# Patient Record
Sex: Male | Born: 1980
Health system: Southern US, Community
[De-identification: ages and names within clinical notes are randomized; demographics above are authoritative.]

## PROBLEM LIST (undated history)

## (undated) DIAGNOSIS — F32A Depression, unspecified: Secondary | ICD-10-CM

## (undated) DIAGNOSIS — K219 Gastro-esophageal reflux disease without esophagitis: Secondary | ICD-10-CM

## (undated) DIAGNOSIS — E7800 Pure hypercholesterolemia, unspecified: Secondary | ICD-10-CM

## (undated) DIAGNOSIS — F419 Anxiety disorder, unspecified: Secondary | ICD-10-CM

## (undated) DIAGNOSIS — F329 Major depressive disorder, single episode, unspecified: Secondary | ICD-10-CM

## (undated) DIAGNOSIS — M549 Dorsalgia, unspecified: Secondary | ICD-10-CM

## (undated) HISTORY — PX: BACK SURGERY: SHX140

## (undated) HISTORY — PX: OTHER SURGICAL HISTORY: SHX169

---

## 2000-03-03 ENCOUNTER — Emergency Department (HOSPITAL_COMMUNITY): Admission: EM | Admit: 2000-03-03 | Discharge: 2000-03-03 | Payer: Self-pay | Admitting: Emergency Medicine

## 2000-03-03 ENCOUNTER — Encounter: Payer: Self-pay | Admitting: Emergency Medicine

## 2001-01-01 ENCOUNTER — Ambulatory Visit (HOSPITAL_COMMUNITY): Admission: RE | Admit: 2001-01-01 | Discharge: 2001-01-01 | Payer: Self-pay | Admitting: Family Medicine

## 2001-01-01 ENCOUNTER — Encounter: Payer: Self-pay | Admitting: Family Medicine

## 2002-09-04 ENCOUNTER — Emergency Department (HOSPITAL_COMMUNITY): Admission: EM | Admit: 2002-09-04 | Discharge: 2002-09-04 | Payer: Self-pay | Admitting: Emergency Medicine

## 2003-12-25 ENCOUNTER — Emergency Department (HOSPITAL_COMMUNITY): Admission: EM | Admit: 2003-12-25 | Discharge: 2003-12-25 | Payer: Self-pay | Admitting: Emergency Medicine

## 2005-03-10 ENCOUNTER — Emergency Department (HOSPITAL_COMMUNITY): Admission: EM | Admit: 2005-03-10 | Discharge: 2005-03-10 | Payer: Self-pay | Admitting: Emergency Medicine

## 2005-12-19 ENCOUNTER — Emergency Department (HOSPITAL_COMMUNITY): Admission: EM | Admit: 2005-12-19 | Discharge: 2005-12-19 | Payer: Self-pay | Admitting: Family Medicine

## 2011-07-31 ENCOUNTER — Other Ambulatory Visit: Payer: Self-pay | Admitting: Neurosurgery

## 2011-07-31 DIAGNOSIS — M545 Low back pain, unspecified: Secondary | ICD-10-CM

## 2011-08-01 ENCOUNTER — Other Ambulatory Visit: Payer: Self-pay

## 2011-08-03 ENCOUNTER — Ambulatory Visit
Admission: RE | Admit: 2011-08-03 | Discharge: 2011-08-03 | Disposition: A | Discharge status: Home / Self Care | Payer: Self-pay | Source: Ambulatory Visit | Attending: Neurosurgery | Admitting: Neurosurgery

## 2011-08-03 DIAGNOSIS — M545 Low back pain, unspecified: Secondary | ICD-10-CM

## 2014-10-25 ENCOUNTER — Other Ambulatory Visit: Payer: Self-pay | Admitting: *Deleted

## 2014-10-25 DIAGNOSIS — M961 Postlaminectomy syndrome, not elsewhere classified: Secondary | ICD-10-CM

## 2014-10-25 DIAGNOSIS — M545 Low back pain: Secondary | ICD-10-CM

## 2014-10-25 DIAGNOSIS — M5136 Other intervertebral disc degeneration, lumbar region: Secondary | ICD-10-CM

## 2014-11-02 ENCOUNTER — Ambulatory Visit
Admission: RE | Admit: 2014-11-02 | Discharge: 2014-11-02 | Disposition: A | Discharge status: Home / Self Care | Payer: BLUE CROSS/BLUE SHIELD | Source: Ambulatory Visit | Attending: *Deleted | Admitting: *Deleted

## 2014-11-02 DIAGNOSIS — M545 Low back pain: Secondary | ICD-10-CM

## 2014-11-02 DIAGNOSIS — M961 Postlaminectomy syndrome, not elsewhere classified: Secondary | ICD-10-CM

## 2014-11-02 DIAGNOSIS — M5136 Other intervertebral disc degeneration, lumbar region: Secondary | ICD-10-CM

## 2014-11-02 MED ORDER — GADOBENATE DIMEGLUMINE 529 MG/ML IV SOLN
20.0000 mL | Freq: Once | INTRAVENOUS | Status: AC | PRN
  Administered 2014-11-02: 20 mL via INTRAVENOUS

## 2015-12-05 DIAGNOSIS — G894 Chronic pain syndrome: Secondary | ICD-10-CM | POA: Diagnosis not present

## 2015-12-05 DIAGNOSIS — M5126 Other intervertebral disc displacement, lumbar region: Secondary | ICD-10-CM | POA: Diagnosis not present

## 2015-12-20 DIAGNOSIS — F17200 Nicotine dependence, unspecified, uncomplicated: Secondary | ICD-10-CM | POA: Diagnosis not present

## 2015-12-20 DIAGNOSIS — E782 Mixed hyperlipidemia: Secondary | ICD-10-CM | POA: Diagnosis not present

## 2015-12-20 DIAGNOSIS — F419 Anxiety disorder, unspecified: Secondary | ICD-10-CM | POA: Diagnosis not present

## 2015-12-20 DIAGNOSIS — G8929 Other chronic pain: Secondary | ICD-10-CM | POA: Diagnosis not present

## 2016-01-02 DIAGNOSIS — G894 Chronic pain syndrome: Secondary | ICD-10-CM | POA: Diagnosis not present

## 2016-02-01 DIAGNOSIS — G894 Chronic pain syndrome: Secondary | ICD-10-CM | POA: Diagnosis not present

## 2016-02-29 DIAGNOSIS — M47896 Other spondylosis, lumbar region: Secondary | ICD-10-CM | POA: Diagnosis not present

## 2016-02-29 DIAGNOSIS — M47817 Spondylosis without myelopathy or radiculopathy, lumbosacral region: Secondary | ICD-10-CM | POA: Diagnosis not present

## 2016-04-01 DIAGNOSIS — Z79899 Other long term (current) drug therapy: Secondary | ICD-10-CM | POA: Diagnosis not present

## 2016-04-01 DIAGNOSIS — M791 Myalgia: Secondary | ICD-10-CM | POA: Diagnosis not present

## 2016-04-01 DIAGNOSIS — M47896 Other spondylosis, lumbar region: Secondary | ICD-10-CM | POA: Diagnosis not present

## 2016-04-03 ENCOUNTER — Other Ambulatory Visit: Payer: Self-pay | Admitting: Rehabilitation

## 2016-04-03 DIAGNOSIS — M47816 Spondylosis without myelopathy or radiculopathy, lumbar region: Secondary | ICD-10-CM

## 2016-04-27 ENCOUNTER — Inpatient Hospital Stay: Admission: RE | Admit: 2016-04-27 | Payer: BLUE CROSS/BLUE SHIELD | Source: Ambulatory Visit

## 2016-05-03 DIAGNOSIS — M47817 Spondylosis without myelopathy or radiculopathy, lumbosacral region: Secondary | ICD-10-CM | POA: Diagnosis not present

## 2016-05-03 DIAGNOSIS — G894 Chronic pain syndrome: Secondary | ICD-10-CM | POA: Diagnosis not present

## 2016-05-30 DIAGNOSIS — M791 Myalgia: Secondary | ICD-10-CM | POA: Diagnosis not present

## 2016-05-30 DIAGNOSIS — M47896 Other spondylosis, lumbar region: Secondary | ICD-10-CM | POA: Diagnosis not present

## 2016-05-30 DIAGNOSIS — M4716 Other spondylosis with myelopathy, lumbar region: Secondary | ICD-10-CM | POA: Diagnosis not present

## 2016-05-30 DIAGNOSIS — G894 Chronic pain syndrome: Secondary | ICD-10-CM | POA: Diagnosis not present

## 2016-06-12 DIAGNOSIS — M5126 Other intervertebral disc displacement, lumbar region: Secondary | ICD-10-CM | POA: Diagnosis not present

## 2016-06-28 DIAGNOSIS — G894 Chronic pain syndrome: Secondary | ICD-10-CM | POA: Diagnosis not present

## 2016-06-28 DIAGNOSIS — M5106 Intervertebral disc disorders with myelopathy, lumbar region: Secondary | ICD-10-CM | POA: Diagnosis not present

## 2016-07-30 DIAGNOSIS — M47896 Other spondylosis, lumbar region: Secondary | ICD-10-CM | POA: Diagnosis not present

## 2016-07-30 DIAGNOSIS — Z79899 Other long term (current) drug therapy: Secondary | ICD-10-CM | POA: Diagnosis not present

## 2016-07-30 DIAGNOSIS — G894 Chronic pain syndrome: Secondary | ICD-10-CM | POA: Diagnosis not present

## 2016-07-30 DIAGNOSIS — M791 Myalgia: Secondary | ICD-10-CM | POA: Diagnosis not present

## 2016-07-30 DIAGNOSIS — M5106 Intervertebral disc disorders with myelopathy, lumbar region: Secondary | ICD-10-CM | POA: Diagnosis not present

## 2016-08-27 DIAGNOSIS — M4716 Other spondylosis with myelopathy, lumbar region: Secondary | ICD-10-CM | POA: Diagnosis not present

## 2016-08-27 DIAGNOSIS — M5106 Intervertebral disc disorders with myelopathy, lumbar region: Secondary | ICD-10-CM | POA: Diagnosis not present

## 2016-08-27 DIAGNOSIS — M545 Low back pain: Secondary | ICD-10-CM | POA: Diagnosis not present

## 2016-08-27 DIAGNOSIS — G894 Chronic pain syndrome: Secondary | ICD-10-CM | POA: Diagnosis not present

## 2016-09-03 ENCOUNTER — Other Ambulatory Visit: Payer: Self-pay | Admitting: Orthopaedic Surgery

## 2016-09-03 DIAGNOSIS — M5106 Intervertebral disc disorders with myelopathy, lumbar region: Secondary | ICD-10-CM

## 2016-09-24 DIAGNOSIS — G894 Chronic pain syndrome: Secondary | ICD-10-CM | POA: Diagnosis not present

## 2016-09-24 DIAGNOSIS — M5106 Intervertebral disc disorders with myelopathy, lumbar region: Secondary | ICD-10-CM | POA: Diagnosis not present

## 2016-09-24 DIAGNOSIS — M4716 Other spondylosis with myelopathy, lumbar region: Secondary | ICD-10-CM | POA: Diagnosis not present

## 2016-09-24 DIAGNOSIS — M545 Low back pain: Secondary | ICD-10-CM | POA: Diagnosis not present

## 2016-10-21 ENCOUNTER — Inpatient Hospital Stay
Admission: RE | Admit: 2016-10-21 | Discharge: 2016-10-21 | Disposition: A | Discharge status: Home / Self Care | Payer: BLUE CROSS/BLUE SHIELD | Source: Ambulatory Visit | Attending: Orthopaedic Surgery | Admitting: Orthopaedic Surgery

## 2016-10-31 DIAGNOSIS — M5106 Intervertebral disc disorders with myelopathy, lumbar region: Secondary | ICD-10-CM | POA: Diagnosis not present

## 2016-10-31 DIAGNOSIS — G894 Chronic pain syndrome: Secondary | ICD-10-CM | POA: Diagnosis not present

## 2016-11-26 DIAGNOSIS — M5106 Intervertebral disc disorders with myelopathy, lumbar region: Secondary | ICD-10-CM | POA: Diagnosis not present

## 2016-11-26 DIAGNOSIS — M791 Myalgia: Secondary | ICD-10-CM | POA: Diagnosis not present

## 2016-12-23 DIAGNOSIS — G894 Chronic pain syndrome: Secondary | ICD-10-CM | POA: Diagnosis not present

## 2016-12-23 DIAGNOSIS — E782 Mixed hyperlipidemia: Secondary | ICD-10-CM | POA: Diagnosis not present

## 2016-12-23 DIAGNOSIS — F419 Anxiety disorder, unspecified: Secondary | ICD-10-CM | POA: Diagnosis not present

## 2016-12-23 DIAGNOSIS — M545 Low back pain: Secondary | ICD-10-CM | POA: Diagnosis not present

## 2016-12-23 DIAGNOSIS — M5106 Intervertebral disc disorders with myelopathy, lumbar region: Secondary | ICD-10-CM | POA: Diagnosis not present

## 2016-12-23 DIAGNOSIS — G8929 Other chronic pain: Secondary | ICD-10-CM | POA: Diagnosis not present

## 2016-12-23 DIAGNOSIS — D45 Polycythemia vera: Secondary | ICD-10-CM | POA: Diagnosis not present

## 2017-01-20 DIAGNOSIS — G894 Chronic pain syndrome: Secondary | ICD-10-CM | POA: Diagnosis not present

## 2017-01-20 DIAGNOSIS — Z79899 Other long term (current) drug therapy: Secondary | ICD-10-CM | POA: Diagnosis not present

## 2017-02-15 ENCOUNTER — Other Ambulatory Visit: Payer: BLUE CROSS/BLUE SHIELD

## 2017-02-28 DIAGNOSIS — Z6834 Body mass index (BMI) 34.0-34.9, adult: Secondary | ICD-10-CM | POA: Diagnosis not present

## 2017-02-28 DIAGNOSIS — G894 Chronic pain syndrome: Secondary | ICD-10-CM | POA: Diagnosis not present

## 2017-02-28 DIAGNOSIS — M5416 Radiculopathy, lumbar region: Secondary | ICD-10-CM | POA: Diagnosis not present

## 2017-02-28 DIAGNOSIS — M5126 Other intervertebral disc displacement, lumbar region: Secondary | ICD-10-CM | POA: Diagnosis not present

## 2017-03-10 ENCOUNTER — Other Ambulatory Visit: Payer: BLUE CROSS/BLUE SHIELD

## 2017-03-28 DIAGNOSIS — M4716 Other spondylosis with myelopathy, lumbar region: Secondary | ICD-10-CM | POA: Diagnosis not present

## 2017-03-28 DIAGNOSIS — G894 Chronic pain syndrome: Secondary | ICD-10-CM | POA: Diagnosis not present

## 2017-03-28 DIAGNOSIS — F419 Anxiety disorder, unspecified: Secondary | ICD-10-CM | POA: Diagnosis not present

## 2017-03-28 DIAGNOSIS — K219 Gastro-esophageal reflux disease without esophagitis: Secondary | ICD-10-CM | POA: Diagnosis not present

## 2017-03-28 DIAGNOSIS — M791 Myalgia: Secondary | ICD-10-CM | POA: Diagnosis not present

## 2017-03-28 DIAGNOSIS — M5106 Intervertebral disc disorders with myelopathy, lumbar region: Secondary | ICD-10-CM | POA: Diagnosis not present

## 2017-04-30 DIAGNOSIS — G894 Chronic pain syndrome: Secondary | ICD-10-CM | POA: Diagnosis not present

## 2017-05-28 DIAGNOSIS — Z6834 Body mass index (BMI) 34.0-34.9, adult: Secondary | ICD-10-CM | POA: Diagnosis not present

## 2017-05-28 DIAGNOSIS — M5106 Intervertebral disc disorders with myelopathy, lumbar region: Secondary | ICD-10-CM | POA: Diagnosis not present

## 2017-05-28 DIAGNOSIS — I10 Essential (primary) hypertension: Secondary | ICD-10-CM | POA: Diagnosis not present

## 2017-05-28 DIAGNOSIS — G894 Chronic pain syndrome: Secondary | ICD-10-CM | POA: Diagnosis not present

## 2017-06-25 DIAGNOSIS — M545 Low back pain: Secondary | ICD-10-CM | POA: Diagnosis not present

## 2017-06-25 DIAGNOSIS — M5126 Other intervertebral disc displacement, lumbar region: Secondary | ICD-10-CM | POA: Diagnosis not present

## 2017-06-25 DIAGNOSIS — G894 Chronic pain syndrome: Secondary | ICD-10-CM | POA: Diagnosis not present

## 2017-07-23 DIAGNOSIS — G894 Chronic pain syndrome: Secondary | ICD-10-CM | POA: Diagnosis not present

## 2017-07-23 DIAGNOSIS — M791 Myalgia, unspecified site: Secondary | ICD-10-CM | POA: Diagnosis not present

## 2017-07-23 DIAGNOSIS — M545 Low back pain: Secondary | ICD-10-CM | POA: Diagnosis not present

## 2017-07-23 DIAGNOSIS — Z6834 Body mass index (BMI) 34.0-34.9, adult: Secondary | ICD-10-CM | POA: Diagnosis not present

## 2017-07-23 DIAGNOSIS — Z79899 Other long term (current) drug therapy: Secondary | ICD-10-CM | POA: Diagnosis not present

## 2017-08-27 DIAGNOSIS — G894 Chronic pain syndrome: Secondary | ICD-10-CM | POA: Diagnosis not present

## 2017-08-27 DIAGNOSIS — M545 Low back pain: Secondary | ICD-10-CM | POA: Diagnosis not present

## 2017-09-24 DIAGNOSIS — F419 Anxiety disorder, unspecified: Secondary | ICD-10-CM | POA: Diagnosis not present

## 2017-09-24 DIAGNOSIS — M5126 Other intervertebral disc displacement, lumbar region: Secondary | ICD-10-CM | POA: Diagnosis not present

## 2017-09-24 DIAGNOSIS — E782 Mixed hyperlipidemia: Secondary | ICD-10-CM | POA: Diagnosis not present

## 2017-09-24 DIAGNOSIS — Z79899 Other long term (current) drug therapy: Secondary | ICD-10-CM | POA: Diagnosis not present

## 2017-09-24 DIAGNOSIS — G894 Chronic pain syndrome: Secondary | ICD-10-CM | POA: Diagnosis not present

## 2017-09-24 DIAGNOSIS — G4700 Insomnia, unspecified: Secondary | ICD-10-CM | POA: Diagnosis not present

## 2017-09-24 DIAGNOSIS — M545 Low back pain: Secondary | ICD-10-CM | POA: Diagnosis not present

## 2017-10-22 DIAGNOSIS — G894 Chronic pain syndrome: Secondary | ICD-10-CM | POA: Diagnosis not present

## 2017-11-19 DIAGNOSIS — M5106 Intervertebral disc disorders with myelopathy, lumbar region: Secondary | ICD-10-CM | POA: Diagnosis not present

## 2017-11-19 DIAGNOSIS — M545 Low back pain: Secondary | ICD-10-CM | POA: Diagnosis not present

## 2017-11-19 DIAGNOSIS — M791 Myalgia, unspecified site: Secondary | ICD-10-CM | POA: Diagnosis not present

## 2017-11-19 DIAGNOSIS — G894 Chronic pain syndrome: Secondary | ICD-10-CM | POA: Diagnosis not present

## 2017-12-25 DIAGNOSIS — M545 Low back pain: Secondary | ICD-10-CM | POA: Diagnosis not present

## 2017-12-25 DIAGNOSIS — G894 Chronic pain syndrome: Secondary | ICD-10-CM | POA: Diagnosis not present

## 2017-12-25 DIAGNOSIS — M5106 Intervertebral disc disorders with myelopathy, lumbar region: Secondary | ICD-10-CM | POA: Diagnosis not present

## 2017-12-25 DIAGNOSIS — Z79899 Other long term (current) drug therapy: Secondary | ICD-10-CM | POA: Diagnosis not present

## 2017-12-25 DIAGNOSIS — M4716 Other spondylosis with myelopathy, lumbar region: Secondary | ICD-10-CM | POA: Diagnosis not present

## 2018-01-22 DIAGNOSIS — M791 Myalgia, unspecified site: Secondary | ICD-10-CM | POA: Diagnosis not present

## 2018-01-22 DIAGNOSIS — M545 Low back pain: Secondary | ICD-10-CM | POA: Diagnosis not present

## 2018-01-22 DIAGNOSIS — G894 Chronic pain syndrome: Secondary | ICD-10-CM | POA: Diagnosis not present

## 2018-01-22 DIAGNOSIS — M5106 Intervertebral disc disorders with myelopathy, lumbar region: Secondary | ICD-10-CM | POA: Diagnosis not present

## 2018-01-28 ENCOUNTER — Other Ambulatory Visit: Payer: Self-pay | Admitting: Orthopaedic Surgery

## 2018-02-05 DIAGNOSIS — M545 Low back pain: Secondary | ICD-10-CM | POA: Diagnosis not present

## 2018-02-19 DIAGNOSIS — M545 Low back pain: Secondary | ICD-10-CM | POA: Diagnosis not present

## 2018-02-19 DIAGNOSIS — M5416 Radiculopathy, lumbar region: Secondary | ICD-10-CM | POA: Diagnosis not present

## 2018-02-19 DIAGNOSIS — G894 Chronic pain syndrome: Secondary | ICD-10-CM | POA: Diagnosis not present

## 2018-02-19 DIAGNOSIS — M5126 Other intervertebral disc displacement, lumbar region: Secondary | ICD-10-CM | POA: Diagnosis not present

## 2018-02-22 ENCOUNTER — Ambulatory Visit (HOSPITAL_COMMUNITY)
Admission: EM | Admit: 2018-02-22 | Discharge: 2018-02-22 | Disposition: A | Discharge status: Home / Self Care | Payer: BLUE CROSS/BLUE SHIELD | Attending: Family Medicine | Admitting: Family Medicine

## 2018-02-22 ENCOUNTER — Encounter (HOSPITAL_COMMUNITY): Payer: Self-pay | Admitting: *Deleted

## 2018-02-22 ENCOUNTER — Other Ambulatory Visit: Payer: Self-pay

## 2018-02-22 DIAGNOSIS — R1011 Right upper quadrant pain: Secondary | ICD-10-CM

## 2018-02-22 HISTORY — DX: Gastro-esophageal reflux disease without esophagitis: K21.9

## 2018-02-22 HISTORY — DX: Dorsalgia, unspecified: M54.9

## 2018-02-22 HISTORY — DX: Anxiety disorder, unspecified: F41.9

## 2018-02-22 HISTORY — DX: Depression, unspecified: F32.A

## 2018-02-22 HISTORY — DX: Major depressive disorder, single episode, unspecified: F32.9

## 2018-02-22 HISTORY — DX: Pure hypercholesterolemia, unspecified: E78.00

## 2018-02-22 LAB — POCT URINALYSIS DIP (DEVICE)
Bilirubin Urine: NEGATIVE
GLUCOSE, UA: NEGATIVE mg/dL
HGB URINE DIPSTICK: NEGATIVE
Ketones, ur: NEGATIVE mg/dL
Leukocytes, UA: NEGATIVE
Nitrite: NEGATIVE
Protein, ur: NEGATIVE mg/dL
Specific Gravity, Urine: 1.025 (ref 1.005–1.030)
UROBILINOGEN UA: 0.2 mg/dL (ref 0.0–1.0)
pH: 6 (ref 5.0–8.0)

## 2018-02-22 NOTE — ED Provider Notes (Signed)
[facility]    CSN: [id number] Arrival date & time: 02/22/18  1901     History   Chief Complaint Chief Complaint  Patient presents with  . Flank Pain    HPI Leonard Ellis is a 37 y.o. male.   This is a 37 y.o. Male, with history of anxiety, depression, GERD, hypercholesteremia, chronic back pain, comes in to urgent care today for 1.5 week duration of right upper quadrant abdominal pain. Pain goes around to the back. Pain most prominent with palpation and when he lays on the right side. Pain is gradually getting worst. It is a stabbing sharp pain. No association with food noted. He occasionally feel a bit nauseous but no vomiting and diarrhea. He have been kind of constipated recently. LBM today. No fever reported.       Past Medical History:  Diagnosis Date  . Anxiety   . Back pain   . Depression   . GERD (gastroesophageal reflux disease)   . Hypercholesteremia     There are no active problems to display for this patient.   Past Surgical History:  Procedure Laterality Date  . BACK SURGERY    . pyloric stenosis         Home Medications    Prior to Admission medications   Medication Sig Start Date End Date Taking? Authorizing Provider  ALPRAZOLAM PO Take by mouth.   Yes [provider]  DULoxetine HCl (CYMBALTA PO) Take by mouth.   Yes [provider]  HYDROCODONE BITARTRATE PO Take by mouth.   Yes [provider]  Omeprazole (PRILOSEC PO) Take by mouth.   Yes [provider]  SIMVASTATIN PO Take by mouth.   Yes [provider]    Family History Family History  Adopted: Yes  Family history unknown: Yes    Social History Social History   Tobacco Use  . Smoking status: Former Games developer  . Smokeless tobacco: Former Engineer, water Use Topics  . Alcohol use: Yes    Comment: occasionally  . Drug use: Never     Allergies   Patient has no known allergies.   Review of Systems Review of  Systems  Constitutional: Negative for chills, fatigue and fever.  Respiratory: Negative for cough and shortness of breath.   Cardiovascular: Negative for chest pain and palpitations.  Gastrointestinal: Positive for abdominal pain. Negative for diarrhea, nausea and vomiting.  Neurological: Negative for dizziness and headaches.     Physical Exam Triage Vital Signs ED Triage Vitals  Enc Vitals Group     BP 02/22/18 1920 (!) 142/87     Pulse Rate 02/22/18 1918 92     Resp 02/22/18 1918 18     Temp 02/22/18 1918 98 F (36.7 C)     Temp Source 02/22/18 1918 Oral     SpO2 02/22/18 1918 96 %     Weight --      Height --      Head Circumference --      Peak Flow --      Pain Score 02/22/18 1920 5     Pain Loc --      Pain Edu? --      Excl. in GC? --    No data found.  Updated Vital Signs BP (!) 142/87   Pulse 92   Temp 98 F (36.7 C) (Oral)   Resp 18   SpO2 96%  Physical Exam  Constitutional: He is oriented to person, place, and  time. He appears well-developed and well-nourished.  HENT:  Head: Normocephalic and atraumatic.  Cardiovascular: Normal rate, regular rhythm and normal heart sounds.  No murmur heard. Pulmonary/Chest: Effort normal and breath sounds normal. He has no wheezes.  Abdominal: Soft. Bowel sounds are normal. He exhibits no mass. There is tenderness (Tender to palpate over RUQ). There is no guarding. No hernia.  Musculoskeletal: Normal range of motion.  Neurological: He is alert and oriented to person, place, and time.  Skin: Skin is warm and dry.  Nursing note and vitals reviewed.    UC Treatments / Results  Labs (all labs ordered are listed, but only abnormal results are displayed) Labs Reviewed  POCT URINALYSIS DIP (DEVICE)    EKG None  Radiology No results found.  Procedures Procedures (including critical care time)  Medications Ordered in UC Medications - No data to display  Initial Impression / Assessment and Plan / UC Course  I  have reviewed the triage vital signs and the nursing notes.  Pertinent labs & imaging results that were available during my care of the patient were reviewed by me and considered in my medical decision making (see chart for details).  Final Clinical Impressions(s) / UC Diagnoses   Final diagnoses:  Right upper quadrant abdominal pain   Patient presents today for RUQ pain. Urinalysis obtained from triage was unremarkable. He specifically wanted an ultrasound for his pain but informed that we do not offer ultrasound or CT abd in the urgent care. Abd xray 2 views would not be beneficial for him. I do agree that patient needs US or CT abd. Advised patient to f/u with PCP for further diagnostic testing. Patient does not exhibit any red flags on exam that would prompt an ER visit.   Discharge Instructions   None    ED Prescriptions    None     Controlled Substance Prescriptions [place] Controlled Substance Registry consulted? Yes   Lucia EstelleZheng, Diamon Reddinger, NP 02/22/18 1949

## 2018-02-22 NOTE — ED Triage Notes (Signed)
C/O intermittent right "side pain" x 1.5 wks.  States sitting or laying in certain positions makes pain worse.  Pain can get sharp and cause nausea at times.

## 2018-03-20 DIAGNOSIS — M5126 Other intervertebral disc displacement, lumbar region: Secondary | ICD-10-CM | POA: Diagnosis not present

## 2018-03-20 DIAGNOSIS — M5416 Radiculopathy, lumbar region: Secondary | ICD-10-CM | POA: Diagnosis not present

## 2018-03-20 DIAGNOSIS — M545 Low back pain: Secondary | ICD-10-CM | POA: Diagnosis not present

## 2018-03-20 DIAGNOSIS — G894 Chronic pain syndrome: Secondary | ICD-10-CM | POA: Diagnosis not present

## 2018-03-20 DIAGNOSIS — Z4689 Encounter for fitting and adjustment of other specified devices: Secondary | ICD-10-CM | POA: Diagnosis not present

## 2018-03-20 DIAGNOSIS — M5106 Intervertebral disc disorders with myelopathy, lumbar region: Secondary | ICD-10-CM | POA: Diagnosis not present

## 2018-03-23 DIAGNOSIS — Z01812 Encounter for preprocedural laboratory examination: Secondary | ICD-10-CM | POA: Diagnosis not present

## 2018-03-23 DIAGNOSIS — Z8614 Personal history of Methicillin resistant Staphylococcus aureus infection: Secondary | ICD-10-CM | POA: Diagnosis not present

## 2018-03-30 DIAGNOSIS — G894 Chronic pain syndrome: Secondary | ICD-10-CM | POA: Diagnosis not present

## 2018-03-30 DIAGNOSIS — M4807 Spinal stenosis, lumbosacral region: Secondary | ICD-10-CM | POA: Diagnosis not present

## 2018-03-30 DIAGNOSIS — J45909 Unspecified asthma, uncomplicated: Secondary | ICD-10-CM | POA: Diagnosis not present

## 2018-03-30 DIAGNOSIS — M4716 Other spondylosis with myelopathy, lumbar region: Secondary | ICD-10-CM | POA: Diagnosis not present

## 2018-03-30 DIAGNOSIS — G4730 Sleep apnea, unspecified: Secondary | ICD-10-CM | POA: Diagnosis not present

## 2018-03-30 DIAGNOSIS — M5137 Other intervertebral disc degeneration, lumbosacral region: Secondary | ICD-10-CM | POA: Diagnosis not present

## 2018-03-30 DIAGNOSIS — M48062 Spinal stenosis, lumbar region with neurogenic claudication: Secondary | ICD-10-CM | POA: Diagnosis not present

## 2018-03-30 DIAGNOSIS — M4726 Other spondylosis with radiculopathy, lumbar region: Secondary | ICD-10-CM | POA: Diagnosis not present

## 2018-03-30 DIAGNOSIS — M4727 Other spondylosis with radiculopathy, lumbosacral region: Secondary | ICD-10-CM | POA: Diagnosis not present

## 2018-03-30 DIAGNOSIS — M5116 Intervertebral disc disorders with radiculopathy, lumbar region: Secondary | ICD-10-CM | POA: Diagnosis not present

## 2018-03-30 DIAGNOSIS — M961 Postlaminectomy syndrome, not elsewhere classified: Secondary | ICD-10-CM | POA: Diagnosis not present

## 2018-03-30 DIAGNOSIS — M5126 Other intervertebral disc displacement, lumbar region: Secondary | ICD-10-CM | POA: Diagnosis not present

## 2018-03-30 DIAGNOSIS — M532X6 Spinal instabilities, lumbar region: Secondary | ICD-10-CM | POA: Diagnosis not present

## 2018-03-30 DIAGNOSIS — M4327 Fusion of spine, lumbosacral region: Secondary | ICD-10-CM | POA: Diagnosis not present

## 2018-03-30 DIAGNOSIS — M4306 Spondylolysis, lumbar region: Secondary | ICD-10-CM | POA: Diagnosis not present

## 2018-03-30 DIAGNOSIS — M545 Low back pain: Secondary | ICD-10-CM | POA: Diagnosis not present

## 2018-03-31 DIAGNOSIS — M4327 Fusion of spine, lumbosacral region: Secondary | ICD-10-CM | POA: Diagnosis not present

## 2018-04-29 DIAGNOSIS — M549 Dorsalgia, unspecified: Secondary | ICD-10-CM | POA: Diagnosis not present

## 2018-05-11 DIAGNOSIS — S60511A Abrasion of right hand, initial encounter: Secondary | ICD-10-CM | POA: Diagnosis not present

## 2018-05-11 DIAGNOSIS — L0390 Cellulitis, unspecified: Secondary | ICD-10-CM | POA: Diagnosis not present

## 2018-05-20 DIAGNOSIS — Z79899 Other long term (current) drug therapy: Secondary | ICD-10-CM | POA: Diagnosis not present

## 2018-05-20 DIAGNOSIS — E782 Mixed hyperlipidemia: Secondary | ICD-10-CM | POA: Diagnosis not present

## 2018-05-20 DIAGNOSIS — G4700 Insomnia, unspecified: Secondary | ICD-10-CM | POA: Diagnosis not present

## 2018-05-20 DIAGNOSIS — F419 Anxiety disorder, unspecified: Secondary | ICD-10-CM | POA: Diagnosis not present

## 2018-06-26 DIAGNOSIS — M5106 Intervertebral disc disorders with myelopathy, lumbar region: Secondary | ICD-10-CM | POA: Diagnosis not present

## 2018-07-24 DIAGNOSIS — Z79899 Other long term (current) drug therapy: Secondary | ICD-10-CM | POA: Diagnosis not present

## 2018-07-24 DIAGNOSIS — G894 Chronic pain syndrome: Secondary | ICD-10-CM | POA: Diagnosis not present

## 2018-07-24 DIAGNOSIS — M545 Low back pain: Secondary | ICD-10-CM | POA: Diagnosis not present

## 2018-07-24 DIAGNOSIS — M4326 Fusion of spine, lumbar region: Secondary | ICD-10-CM | POA: Diagnosis not present

## 2018-08-21 DIAGNOSIS — G894 Chronic pain syndrome: Secondary | ICD-10-CM | POA: Diagnosis not present

## 2018-08-21 DIAGNOSIS — M4326 Fusion of spine, lumbar region: Secondary | ICD-10-CM | POA: Diagnosis not present

## 2018-08-21 DIAGNOSIS — M545 Low back pain: Secondary | ICD-10-CM | POA: Diagnosis not present

## 2018-08-21 DIAGNOSIS — Z6834 Body mass index (BMI) 34.0-34.9, adult: Secondary | ICD-10-CM | POA: Diagnosis not present

## 2018-09-21 DIAGNOSIS — M545 Low back pain: Secondary | ICD-10-CM | POA: Diagnosis not present

## 2018-09-21 DIAGNOSIS — M7918 Myalgia, other site: Secondary | ICD-10-CM | POA: Diagnosis not present

## 2018-09-21 DIAGNOSIS — G894 Chronic pain syndrome: Secondary | ICD-10-CM | POA: Diagnosis not present

## 2018-09-21 DIAGNOSIS — M4326 Fusion of spine, lumbar region: Secondary | ICD-10-CM | POA: Diagnosis not present

## 2018-10-23 DIAGNOSIS — M4326 Fusion of spine, lumbar region: Secondary | ICD-10-CM | POA: Diagnosis not present

## 2018-10-23 DIAGNOSIS — M5106 Intervertebral disc disorders with myelopathy, lumbar region: Secondary | ICD-10-CM | POA: Diagnosis not present

## 2018-10-23 DIAGNOSIS — M791 Myalgia, unspecified site: Secondary | ICD-10-CM | POA: Diagnosis not present

## 2018-10-23 DIAGNOSIS — M545 Low back pain: Secondary | ICD-10-CM | POA: Diagnosis not present

## 2018-11-20 DIAGNOSIS — M791 Myalgia, unspecified site: Secondary | ICD-10-CM | POA: Diagnosis not present

## 2018-11-20 DIAGNOSIS — M4326 Fusion of spine, lumbar region: Secondary | ICD-10-CM | POA: Diagnosis not present

## 2018-11-20 DIAGNOSIS — M545 Low back pain: Secondary | ICD-10-CM | POA: Diagnosis not present

## 2018-11-25 DIAGNOSIS — F419 Anxiety disorder, unspecified: Secondary | ICD-10-CM | POA: Diagnosis not present

## 2018-11-25 DIAGNOSIS — K219 Gastro-esophageal reflux disease without esophagitis: Secondary | ICD-10-CM | POA: Diagnosis not present

## 2018-11-25 DIAGNOSIS — E782 Mixed hyperlipidemia: Secondary | ICD-10-CM | POA: Diagnosis not present

## 2018-11-25 DIAGNOSIS — G4700 Insomnia, unspecified: Secondary | ICD-10-CM | POA: Diagnosis not present

## 2018-12-24 DIAGNOSIS — M4326 Fusion of spine, lumbar region: Secondary | ICD-10-CM | POA: Diagnosis not present

## 2018-12-24 DIAGNOSIS — M545 Low back pain: Secondary | ICD-10-CM | POA: Diagnosis not present

## 2018-12-24 DIAGNOSIS — M791 Myalgia, unspecified site: Secondary | ICD-10-CM | POA: Diagnosis not present

## 2019-02-09 DIAGNOSIS — I700 Atherosclerosis of aorta: Secondary | ICD-10-CM | POA: Diagnosis not present

## 2019-02-09 DIAGNOSIS — M48061 Spinal stenosis, lumbar region without neurogenic claudication: Secondary | ICD-10-CM | POA: Diagnosis not present

## 2019-02-09 DIAGNOSIS — Z981 Arthrodesis status: Secondary | ICD-10-CM | POA: Diagnosis not present

## 2019-02-09 DIAGNOSIS — M4326 Fusion of spine, lumbar region: Secondary | ICD-10-CM | POA: Diagnosis not present

## 2019-02-09 DIAGNOSIS — M545 Low back pain: Secondary | ICD-10-CM | POA: Diagnosis not present

## 2019-03-03 DIAGNOSIS — M545 Low back pain: Secondary | ICD-10-CM | POA: Diagnosis not present

## 2019-03-03 DIAGNOSIS — M4326 Fusion of spine, lumbar region: Secondary | ICD-10-CM | POA: Diagnosis not present

## 2019-03-29 DIAGNOSIS — M4326 Fusion of spine, lumbar region: Secondary | ICD-10-CM | POA: Diagnosis not present

## 2019-03-29 DIAGNOSIS — M545 Low back pain: Secondary | ICD-10-CM | POA: Diagnosis not present

## 2019-03-29 DIAGNOSIS — M5106 Intervertebral disc disorders with myelopathy, lumbar region: Secondary | ICD-10-CM | POA: Diagnosis not present

## 2019-04-13 DIAGNOSIS — D72829 Elevated white blood cell count, unspecified: Secondary | ICD-10-CM | POA: Diagnosis not present

## 2019-04-13 DIAGNOSIS — I708 Atherosclerosis of other arteries: Secondary | ICD-10-CM | POA: Diagnosis not present

## 2019-04-13 DIAGNOSIS — F419 Anxiety disorder, unspecified: Secondary | ICD-10-CM | POA: Diagnosis not present

## 2019-04-13 DIAGNOSIS — Z981 Arthrodesis status: Secondary | ICD-10-CM | POA: Diagnosis not present

## 2019-04-13 DIAGNOSIS — K219 Gastro-esophageal reflux disease without esophagitis: Secondary | ICD-10-CM | POA: Diagnosis not present

## 2019-04-13 DIAGNOSIS — E7800 Pure hypercholesterolemia, unspecified: Secondary | ICD-10-CM | POA: Diagnosis not present

## 2019-04-13 DIAGNOSIS — R1032 Left lower quadrant pain: Secondary | ICD-10-CM | POA: Diagnosis not present

## 2019-04-13 DIAGNOSIS — Z6833 Body mass index (BMI) 33.0-33.9, adult: Secondary | ICD-10-CM | POA: Diagnosis not present

## 2019-04-13 DIAGNOSIS — F329 Major depressive disorder, single episode, unspecified: Secondary | ICD-10-CM | POA: Diagnosis not present

## 2019-04-13 DIAGNOSIS — M549 Dorsalgia, unspecified: Secondary | ICD-10-CM | POA: Diagnosis not present

## 2019-04-13 DIAGNOSIS — G8929 Other chronic pain: Secondary | ICD-10-CM | POA: Diagnosis not present

## 2019-04-13 DIAGNOSIS — Z79899 Other long term (current) drug therapy: Secondary | ICD-10-CM | POA: Diagnosis not present

## 2019-04-15 DIAGNOSIS — Z79899 Other long term (current) drug therapy: Secondary | ICD-10-CM | POA: Diagnosis not present

## 2019-04-15 DIAGNOSIS — F419 Anxiety disorder, unspecified: Secondary | ICD-10-CM | POA: Diagnosis not present

## 2019-04-28 DIAGNOSIS — M5106 Intervertebral disc disorders with myelopathy, lumbar region: Secondary | ICD-10-CM | POA: Diagnosis not present

## 2019-04-28 DIAGNOSIS — M545 Low back pain: Secondary | ICD-10-CM | POA: Diagnosis not present

## 2019-04-28 DIAGNOSIS — M4326 Fusion of spine, lumbar region: Secondary | ICD-10-CM | POA: Diagnosis not present

## 2019-05-31 DIAGNOSIS — M4326 Fusion of spine, lumbar region: Secondary | ICD-10-CM | POA: Diagnosis not present

## 2019-05-31 DIAGNOSIS — M545 Low back pain: Secondary | ICD-10-CM | POA: Diagnosis not present

## 2019-05-31 DIAGNOSIS — M5106 Intervertebral disc disorders with myelopathy, lumbar region: Secondary | ICD-10-CM | POA: Diagnosis not present

## 2019-06-30 DIAGNOSIS — M545 Low back pain: Secondary | ICD-10-CM | POA: Diagnosis not present

## 2019-06-30 DIAGNOSIS — M5106 Intervertebral disc disorders with myelopathy, lumbar region: Secondary | ICD-10-CM | POA: Diagnosis not present

## 2019-06-30 DIAGNOSIS — M4326 Fusion of spine, lumbar region: Secondary | ICD-10-CM | POA: Diagnosis not present

## 2019-06-30 DIAGNOSIS — Z79899 Other long term (current) drug therapy: Secondary | ICD-10-CM | POA: Diagnosis not present

## 2019-08-02 DIAGNOSIS — M545 Low back pain: Secondary | ICD-10-CM | POA: Diagnosis not present

## 2019-08-02 DIAGNOSIS — M4326 Fusion of spine, lumbar region: Secondary | ICD-10-CM | POA: Diagnosis not present

## 2019-08-02 DIAGNOSIS — M5106 Intervertebral disc disorders with myelopathy, lumbar region: Secondary | ICD-10-CM | POA: Diagnosis not present

## 2019-08-24 DIAGNOSIS — M545 Low back pain: Secondary | ICD-10-CM | POA: Diagnosis not present

## 2019-08-24 DIAGNOSIS — M5106 Intervertebral disc disorders with myelopathy, lumbar region: Secondary | ICD-10-CM | POA: Diagnosis not present

## 2019-08-24 DIAGNOSIS — M4326 Fusion of spine, lumbar region: Secondary | ICD-10-CM | POA: Diagnosis not present

## 2021-12-28 ENCOUNTER — Other Ambulatory Visit: Payer: Self-pay | Admitting: Orthopaedic Surgery

## 2021-12-28 DIAGNOSIS — M5416 Radiculopathy, lumbar region: Secondary | ICD-10-CM

## 2021-12-28 DIAGNOSIS — M4326 Fusion of spine, lumbar region: Secondary | ICD-10-CM

## 2022-01-19 ENCOUNTER — Ambulatory Visit
Admission: RE | Admit: 2022-01-19 | Discharge: 2022-01-19 | Disposition: A | Discharge status: Home / Self Care | Payer: Medicare Other | Source: Ambulatory Visit | Attending: Orthopaedic Surgery | Admitting: Orthopaedic Surgery

## 2022-01-19 DIAGNOSIS — M4326 Fusion of spine, lumbar region: Secondary | ICD-10-CM

## 2022-01-19 DIAGNOSIS — M5416 Radiculopathy, lumbar region: Secondary | ICD-10-CM

## 2022-01-19 MED ORDER — GADOBENATE DIMEGLUMINE 529 MG/ML IV SOLN
20.0000 mL | Freq: Once | INTRAVENOUS | Status: AC | PRN
  Administered 2022-01-19: 20 mL via INTRAVENOUS

## 2022-09-09 DIAGNOSIS — M546 Pain in thoracic spine: Secondary | ICD-10-CM | POA: Diagnosis not present

## 2022-09-09 DIAGNOSIS — M791 Myalgia, unspecified site: Secondary | ICD-10-CM | POA: Diagnosis not present

## 2022-09-09 DIAGNOSIS — M4326 Fusion of spine, lumbar region: Secondary | ICD-10-CM | POA: Diagnosis not present

## 2022-10-07 DIAGNOSIS — M4326 Fusion of spine, lumbar region: Secondary | ICD-10-CM | POA: Diagnosis not present

## 2022-10-07 DIAGNOSIS — M5106 Intervertebral disc disorders with myelopathy, lumbar region: Secondary | ICD-10-CM | POA: Diagnosis not present

## 2022-10-07 DIAGNOSIS — M47894 Other spondylosis, thoracic region: Secondary | ICD-10-CM | POA: Diagnosis not present

## 2022-10-07 DIAGNOSIS — M546 Pain in thoracic spine: Secondary | ICD-10-CM | POA: Diagnosis not present

## 2022-10-07 DIAGNOSIS — M791 Myalgia, unspecified site: Secondary | ICD-10-CM | POA: Diagnosis not present

## 2022-10-17 DIAGNOSIS — Z981 Arthrodesis status: Secondary | ICD-10-CM | POA: Diagnosis not present

## 2022-10-17 DIAGNOSIS — G4720 Circadian rhythm sleep disorder, unspecified type: Secondary | ICD-10-CM | POA: Diagnosis not present

## 2022-10-17 DIAGNOSIS — Z8241 Family history of sudden cardiac death: Secondary | ICD-10-CM | POA: Diagnosis not present

## 2022-10-17 DIAGNOSIS — E119 Type 2 diabetes mellitus without complications: Secondary | ICD-10-CM | POA: Diagnosis not present

## 2022-10-28 DIAGNOSIS — Z79891 Long term (current) use of opiate analgesic: Secondary | ICD-10-CM | POA: Diagnosis not present

## 2022-10-28 DIAGNOSIS — M4326 Fusion of spine, lumbar region: Secondary | ICD-10-CM | POA: Diagnosis not present

## 2022-10-28 DIAGNOSIS — G894 Chronic pain syndrome: Secondary | ICD-10-CM | POA: Diagnosis not present

## 2022-10-28 DIAGNOSIS — M791 Myalgia, unspecified site: Secondary | ICD-10-CM | POA: Diagnosis not present

## 2022-10-28 DIAGNOSIS — Z79899 Other long term (current) drug therapy: Secondary | ICD-10-CM | POA: Diagnosis not present

## 2022-10-28 DIAGNOSIS — M47894 Other spondylosis, thoracic region: Secondary | ICD-10-CM | POA: Diagnosis not present

## 2022-10-28 DIAGNOSIS — M546 Pain in thoracic spine: Secondary | ICD-10-CM | POA: Diagnosis not present

## 2022-11-01 DIAGNOSIS — E119 Type 2 diabetes mellitus without complications: Secondary | ICD-10-CM | POA: Diagnosis not present

## 2022-11-01 DIAGNOSIS — R7989 Other specified abnormal findings of blood chemistry: Secondary | ICD-10-CM | POA: Diagnosis not present

## 2022-11-01 DIAGNOSIS — I10 Essential (primary) hypertension: Secondary | ICD-10-CM | POA: Diagnosis not present

## 2022-12-26 DIAGNOSIS — M4326 Fusion of spine, lumbar region: Secondary | ICD-10-CM | POA: Diagnosis not present

## 2022-12-26 DIAGNOSIS — M47894 Other spondylosis, thoracic region: Secondary | ICD-10-CM | POA: Diagnosis not present

## 2022-12-26 DIAGNOSIS — M791 Myalgia, unspecified site: Secondary | ICD-10-CM | POA: Diagnosis not present

## 2022-12-26 DIAGNOSIS — M546 Pain in thoracic spine: Secondary | ICD-10-CM | POA: Diagnosis not present

## 2023-01-06 DIAGNOSIS — M546 Pain in thoracic spine: Secondary | ICD-10-CM | POA: Diagnosis not present

## 2023-01-13 DIAGNOSIS — Z8241 Family history of sudden cardiac death: Secondary | ICD-10-CM | POA: Diagnosis not present

## 2023-01-13 DIAGNOSIS — Z8249 Family history of ischemic heart disease and other diseases of the circulatory system: Secondary | ICD-10-CM | POA: Diagnosis not present

## 2023-01-13 DIAGNOSIS — R42 Dizziness and giddiness: Secondary | ICD-10-CM | POA: Diagnosis not present

## 2023-01-13 DIAGNOSIS — E782 Mixed hyperlipidemia: Secondary | ICD-10-CM | POA: Diagnosis not present

## 2023-02-03 DIAGNOSIS — I10 Essential (primary) hypertension: Secondary | ICD-10-CM | POA: Diagnosis not present

## 2023-02-03 DIAGNOSIS — Z8241 Family history of sudden cardiac death: Secondary | ICD-10-CM | POA: Diagnosis not present

## 2023-02-03 DIAGNOSIS — E119 Type 2 diabetes mellitus without complications: Secondary | ICD-10-CM | POA: Diagnosis not present

## 2023-02-03 DIAGNOSIS — Z981 Arthrodesis status: Secondary | ICD-10-CM | POA: Diagnosis not present

## 2023-02-03 DIAGNOSIS — G4720 Circadian rhythm sleep disorder, unspecified type: Secondary | ICD-10-CM | POA: Diagnosis not present

## 2023-02-04 DIAGNOSIS — Z8241 Family history of sudden cardiac death: Secondary | ICD-10-CM | POA: Diagnosis not present

## 2023-02-04 DIAGNOSIS — R42 Dizziness and giddiness: Secondary | ICD-10-CM | POA: Diagnosis not present

## 2023-02-05 DIAGNOSIS — I491 Atrial premature depolarization: Secondary | ICD-10-CM | POA: Diagnosis not present

## 2023-02-05 DIAGNOSIS — I517 Cardiomegaly: Secondary | ICD-10-CM | POA: Diagnosis not present

## 2023-02-05 DIAGNOSIS — Z8241 Family history of sudden cardiac death: Secondary | ICD-10-CM | POA: Diagnosis not present

## 2023-02-05 DIAGNOSIS — R000 Tachycardia, unspecified: Secondary | ICD-10-CM | POA: Diagnosis not present

## 2023-02-27 DIAGNOSIS — M4326 Fusion of spine, lumbar region: Secondary | ICD-10-CM | POA: Diagnosis not present

## 2023-02-27 DIAGNOSIS — M546 Pain in thoracic spine: Secondary | ICD-10-CM | POA: Diagnosis not present

## 2023-04-16 DIAGNOSIS — Z8249 Family history of ischemic heart disease and other diseases of the circulatory system: Secondary | ICD-10-CM | POA: Diagnosis not present

## 2023-04-16 DIAGNOSIS — E785 Hyperlipidemia, unspecified: Secondary | ICD-10-CM | POA: Diagnosis not present

## 2023-04-28 DIAGNOSIS — E785 Hyperlipidemia, unspecified: Secondary | ICD-10-CM | POA: Diagnosis not present

## 2023-04-28 DIAGNOSIS — I10 Essential (primary) hypertension: Secondary | ICD-10-CM | POA: Diagnosis not present

## 2023-04-28 DIAGNOSIS — L609 Nail disorder, unspecified: Secondary | ICD-10-CM | POA: Diagnosis not present

## 2023-04-28 DIAGNOSIS — E119 Type 2 diabetes mellitus without complications: Secondary | ICD-10-CM | POA: Diagnosis not present

## 2023-04-29 DIAGNOSIS — Z79899 Other long term (current) drug therapy: Secondary | ICD-10-CM | POA: Diagnosis not present

## 2023-04-29 DIAGNOSIS — M546 Pain in thoracic spine: Secondary | ICD-10-CM | POA: Diagnosis not present

## 2023-04-29 DIAGNOSIS — Z79891 Long term (current) use of opiate analgesic: Secondary | ICD-10-CM | POA: Diagnosis not present

## 2023-04-29 DIAGNOSIS — M4326 Fusion of spine, lumbar region: Secondary | ICD-10-CM | POA: Diagnosis not present

## 2023-04-29 DIAGNOSIS — G894 Chronic pain syndrome: Secondary | ICD-10-CM | POA: Diagnosis not present

## 2023-04-29 DIAGNOSIS — M791 Myalgia, unspecified site: Secondary | ICD-10-CM | POA: Diagnosis not present

## 2023-05-15 IMAGING — MR MR LUMBAR SPINE WO/W CM
4 of 7 series · 21 of 48 positions shown · IV contrast (multihance)
Comparison: [facility] Jumper Lumbar CT myelogram 02/09/2019.

CLINICAL DATA: 40-year-old male with low back pain. Prior surgery.
Spinal injection 2 weeks ago with little improvement.

EXAM:
MRI LUMBAR SPINE WITHOUT AND WITH CONTRAST
TECHNIQUE: Multiplanar and multiecho pulse sequences of the lumbar spine were
obtained without and with intravenous contrast.
CONTRAST:  20mL MULTIHANCE GADOBENATE DIMEGLUMINE 529 MG/ML IV SOLN

[Series 6: T1 · sagittal · 4.0mm · 0.73mm/px · 5 of 15 slices shown]
[im 1/15]
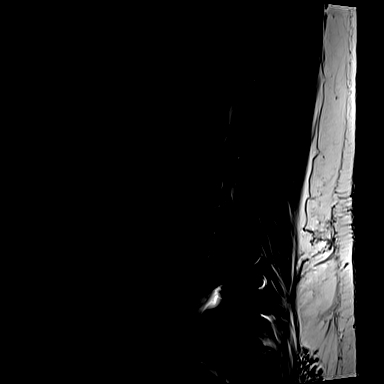
[im 4/15]
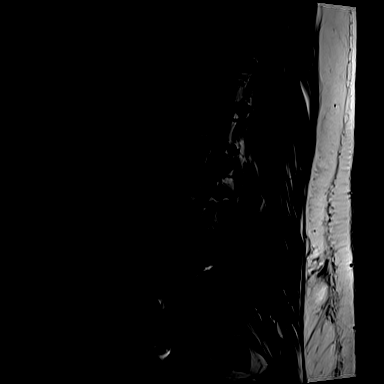
[im 8/15]
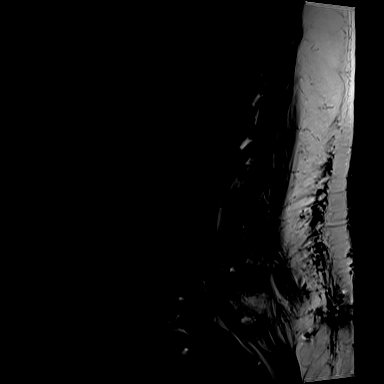
[im 11/15]
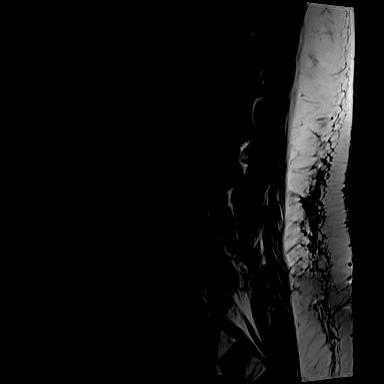
[im 15/15]
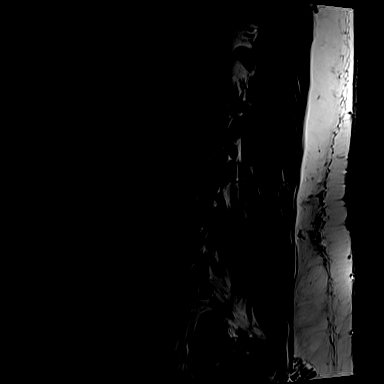

[Series 11: T2 · axial · 4.0mm · 0.28mm/px · z∈[-95,+109]mm · 8 of 38 slices shown (1 of 2)]
[im 1/38]
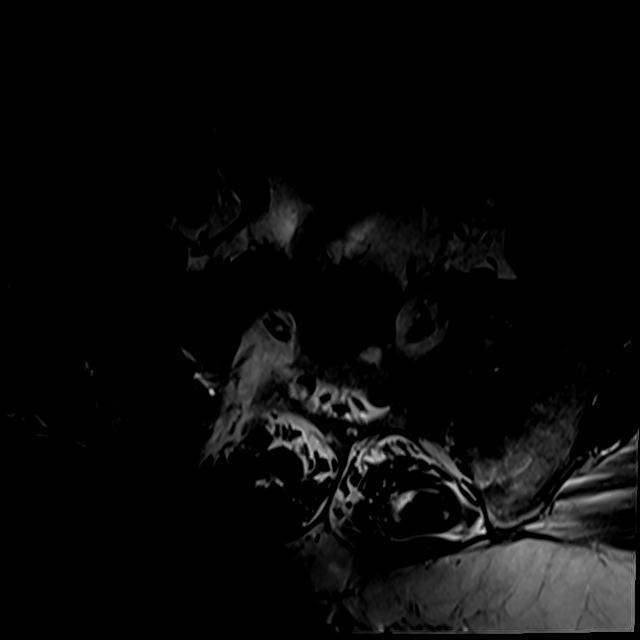
[im 5/38]
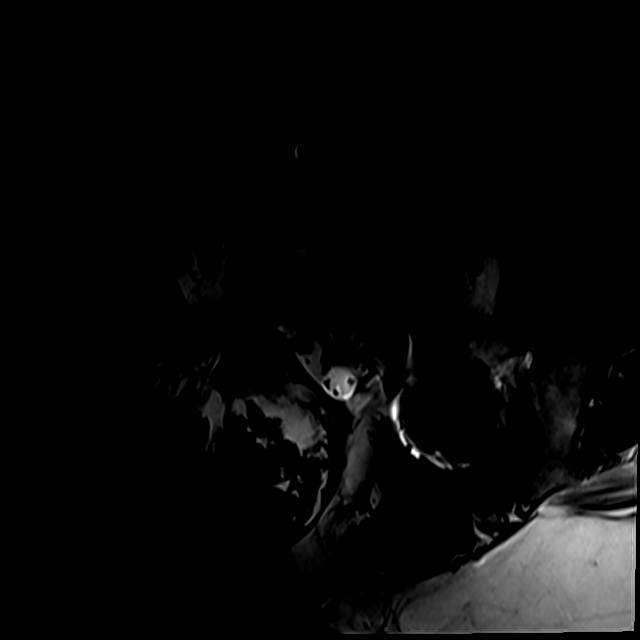
[im 13/38]
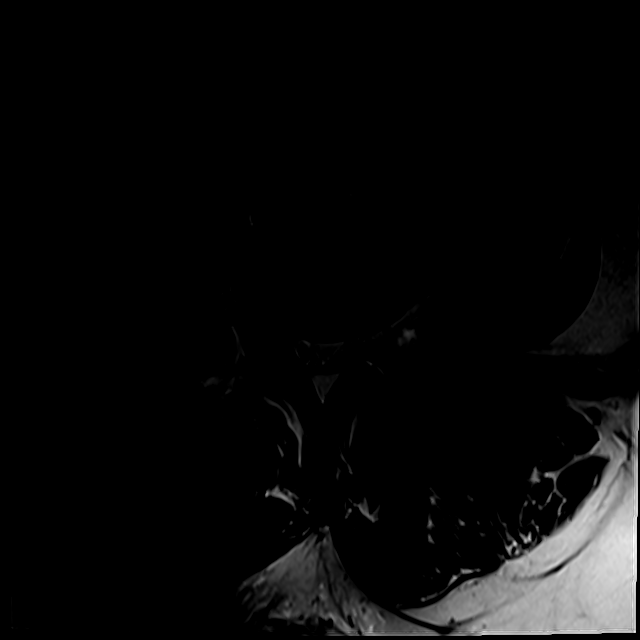
[im 17/38]
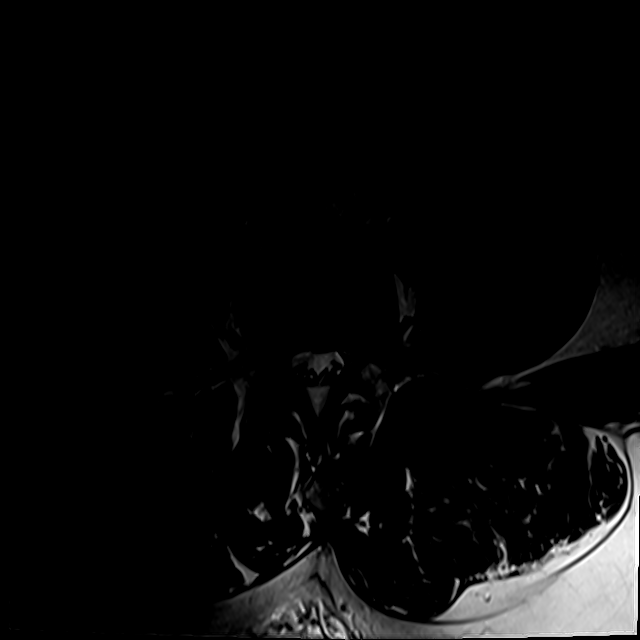
[im 21/38]
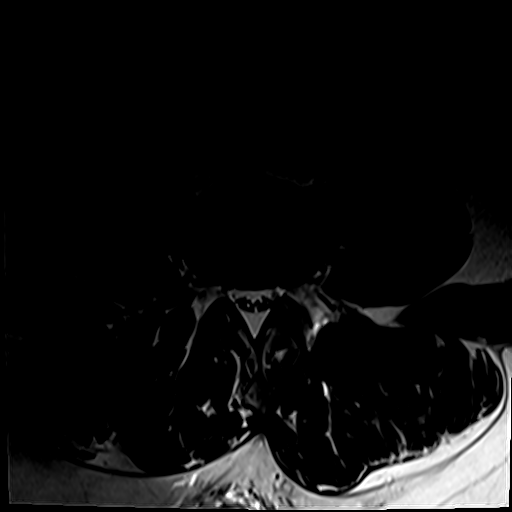
[im 25/38]
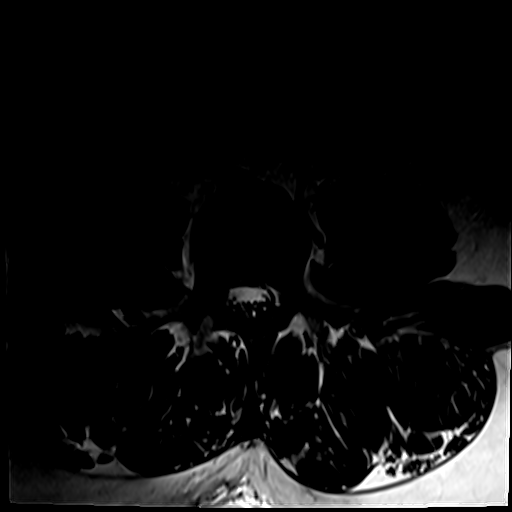
[im 33/38]
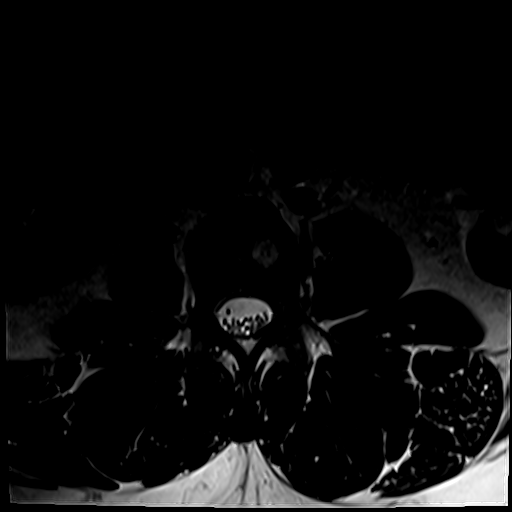
[im 38/38]
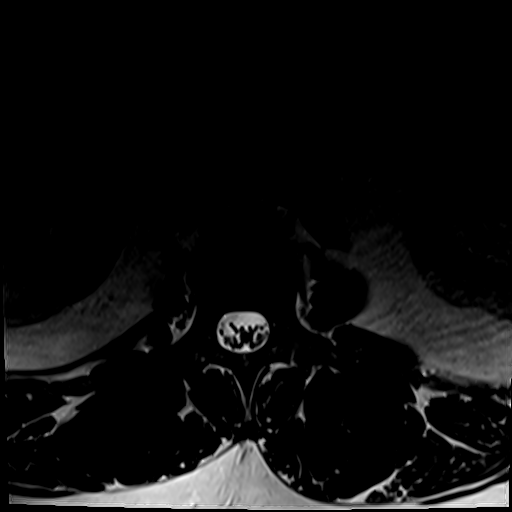

[Series 12: T2 · sagittal · 4.0mm · 0.73mm/px · 4 of 15 slices shown (2 of 2)]
[im 1/15]
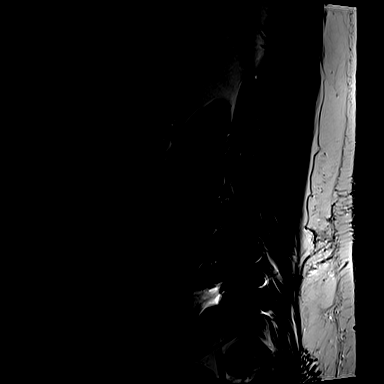
[im 5/15]
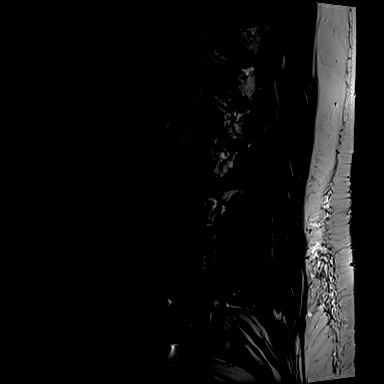
[im 10/15]
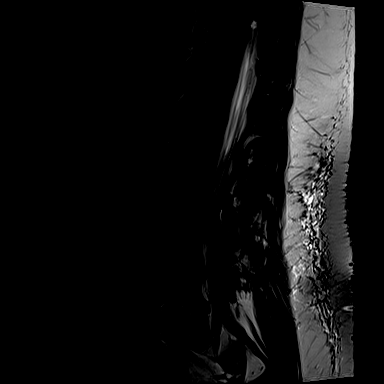
[im 15/15]
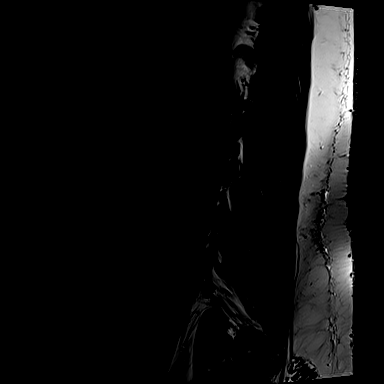

[Series 13: T1 fat-sat post-contrast · sagittal · 4.0mm · 0.73mm/px · 4 of 15 slices shown]
[im 1/15]
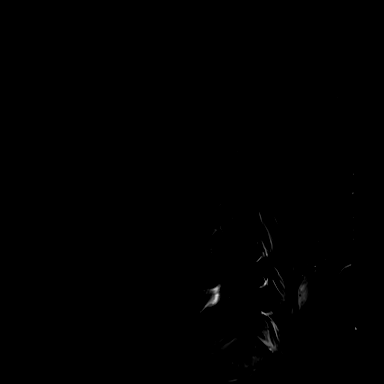
[im 5/15]
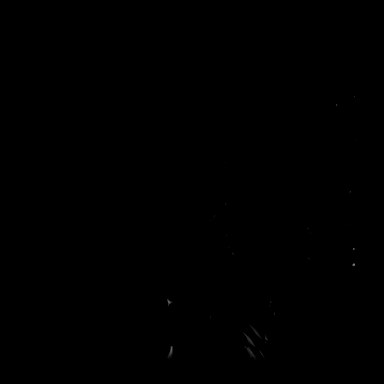
[im 10/15]
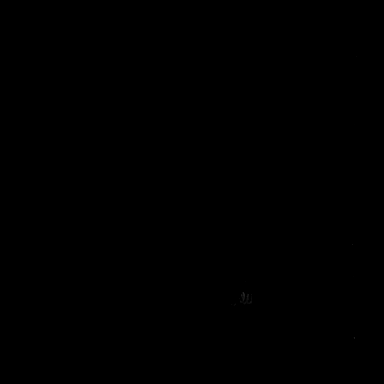
[im 15/15]
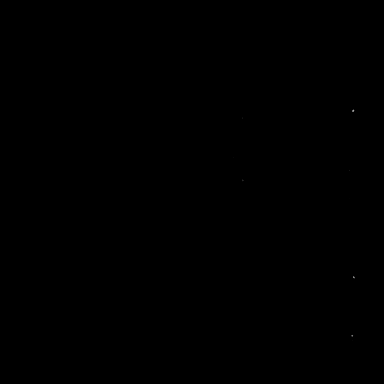

[21 of 48 positions shown; findings below may reference images not displayed]

FINDINGS: Segmentation:  Normal on the 7373 comparison.

Alignment: Stable lumbar vertebral height and alignment since that
time. Subtle retrolisthesis of L4 on L5.

Vertebrae: Anterior and posterior fusion hardware at L5-S1 with
susceptibility artifact. No convincing No marrow edema or evidence
of acute osseous abnormality. Normal background bone marrow signal.
L2 benign vertebral body hemangioma. Mostly anterior chronic
degenerative endplate marrow signal changes at T11-T12. Intact
visible sacrum.

Conus medullaris and cauda equina: Conus extends to the L1 level. No
lower spinal cord or conus signal abnormality. Incidental fatty
filum terminalis (normal variant). No abnormal intradural
enhancement or dural thickening identified.

Paraspinal and other soft tissues: Postoperative changes to the
paraspinal soft tissues at L5-S1 with no postoperative fluid
collection. Negative visible abdominal viscera.

Disc levels:

T11-T12: Right foraminal disc protrusion visible on series 12, image
4). Mild to moderate right T11 foraminal stenosis.

T12-L1:  Negative.

L1-L2:  Negative.

L2-L3:  Negative.

L3-L4: Disc desiccation. Circumferential disc bulge and superimposed
caudal disc extrusion to the left of midline. 4 mm thick by roughly
14 mm long sequestered disc fragment posterior to the left L4
vertebral body (series 12, image 9 and series 11, image 23).
Superimposed mild facet hypertrophy. Only mild left lateral recess
stenosis (descending left L4 nerve level). Borderline spinal
stenosis. No L3 foraminal stenosis.

L4-L5: Disc desiccation. Mild posterior disc bulging with annular
fissure of the disc. Mild facet hypertrophy.

Susceptibility artifact just below the disc level due to left-side
pedicle screws. On axial images there is evidence of caudal extruded
disc in the left lateral recess (heterogeneous on T2 weighted
imaging and mostly enhancing series 11, image 28). Moderate left
lateral recess stenosis and involvement of the descending left L5
nerve roots. No significant spinal stenosis. No L4 foraminal
stenosis.

L5-S1: Prior decompression, fusion. Epidural lipomatosis. Endplate
spurring. No significant stenosis.
IMPRESSION: 1. Prior decompression and fusion at L5-S1 with no adverse features.

2. Susceptibility artifact related to #1 limits visualization on the
sagittal images, but there is strong evidence on axial images of
caudal disc extrusions and sequestered disc fragments involving the
left lateral recesses at both L3-L4 and L4-L5. Descending left L5
nerve level appears most affected, left L4 nerve level slightly less
so. Query associated Radiculitis.

3. Otherwise borderline multifactorial spinal stenosis at L3-L4.

4. Lower thoracic right foraminal disc protrusion at T11-T12 with
mild to moderate right T11 foraminal stenosis.

## 2023-07-01 DIAGNOSIS — M4326 Fusion of spine, lumbar region: Secondary | ICD-10-CM | POA: Diagnosis not present

## 2023-07-01 DIAGNOSIS — M546 Pain in thoracic spine: Secondary | ICD-10-CM | POA: Diagnosis not present

## 2023-07-14 DIAGNOSIS — E119 Type 2 diabetes mellitus without complications: Secondary | ICD-10-CM | POA: Diagnosis not present

## 2023-07-14 DIAGNOSIS — R5383 Other fatigue: Secondary | ICD-10-CM | POA: Diagnosis not present

## 2023-07-14 DIAGNOSIS — R7989 Other specified abnormal findings of blood chemistry: Secondary | ICD-10-CM | POA: Diagnosis not present

## 2023-07-14 DIAGNOSIS — I10 Essential (primary) hypertension: Secondary | ICD-10-CM | POA: Diagnosis not present

## 2023-07-14 DIAGNOSIS — R5381 Other malaise: Secondary | ICD-10-CM | POA: Diagnosis not present

## 2023-07-15 DIAGNOSIS — I10 Essential (primary) hypertension: Secondary | ICD-10-CM | POA: Diagnosis not present

## 2023-07-15 DIAGNOSIS — R7989 Other specified abnormal findings of blood chemistry: Secondary | ICD-10-CM | POA: Diagnosis not present

## 2023-07-15 DIAGNOSIS — R5381 Other malaise: Secondary | ICD-10-CM | POA: Diagnosis not present

## 2023-08-04 DIAGNOSIS — R7989 Other specified abnormal findings of blood chemistry: Secondary | ICD-10-CM | POA: Diagnosis not present

## 2023-08-04 DIAGNOSIS — E119 Type 2 diabetes mellitus without complications: Secondary | ICD-10-CM | POA: Diagnosis not present

## 2023-08-06 DIAGNOSIS — R7989 Other specified abnormal findings of blood chemistry: Secondary | ICD-10-CM | POA: Diagnosis not present

## 2023-09-01 DIAGNOSIS — M5106 Intervertebral disc disorders with myelopathy, lumbar region: Secondary | ICD-10-CM | POA: Diagnosis not present

## 2023-09-01 DIAGNOSIS — M546 Pain in thoracic spine: Secondary | ICD-10-CM | POA: Diagnosis not present

## 2023-09-04 DIAGNOSIS — R7989 Other specified abnormal findings of blood chemistry: Secondary | ICD-10-CM | POA: Diagnosis not present

## 2023-09-25 DIAGNOSIS — Z139 Encounter for screening, unspecified: Secondary | ICD-10-CM | POA: Diagnosis not present

## 2023-09-25 DIAGNOSIS — Z0000 Encounter for general adult medical examination without abnormal findings: Secondary | ICD-10-CM | POA: Diagnosis not present

## 2023-09-25 DIAGNOSIS — Z9181 History of falling: Secondary | ICD-10-CM | POA: Diagnosis not present

## 2023-10-06 DIAGNOSIS — R7989 Other specified abnormal findings of blood chemistry: Secondary | ICD-10-CM | POA: Diagnosis not present

## 2023-10-22 DIAGNOSIS — M79642 Pain in left hand: Secondary | ICD-10-CM | POA: Diagnosis not present

## 2023-10-22 DIAGNOSIS — M79641 Pain in right hand: Secondary | ICD-10-CM | POA: Diagnosis not present

## 2023-10-27 DIAGNOSIS — M545 Low back pain, unspecified: Secondary | ICD-10-CM | POA: Diagnosis not present

## 2023-10-27 DIAGNOSIS — G894 Chronic pain syndrome: Secondary | ICD-10-CM | POA: Diagnosis not present

## 2023-10-27 DIAGNOSIS — M546 Pain in thoracic spine: Secondary | ICD-10-CM | POA: Diagnosis not present

## 2023-10-27 DIAGNOSIS — M5106 Intervertebral disc disorders with myelopathy, lumbar region: Secondary | ICD-10-CM | POA: Diagnosis not present

## 2023-11-05 DIAGNOSIS — I10 Essential (primary) hypertension: Secondary | ICD-10-CM | POA: Diagnosis not present

## 2023-11-05 DIAGNOSIS — E785 Hyperlipidemia, unspecified: Secondary | ICD-10-CM | POA: Diagnosis not present

## 2023-11-05 DIAGNOSIS — G4700 Insomnia, unspecified: Secondary | ICD-10-CM | POA: Diagnosis not present

## 2023-11-05 DIAGNOSIS — E119 Type 2 diabetes mellitus without complications: Secondary | ICD-10-CM | POA: Diagnosis not present

## 2023-12-03 DIAGNOSIS — Z79899 Other long term (current) drug therapy: Secondary | ICD-10-CM | POA: Diagnosis not present

## 2023-12-03 DIAGNOSIS — M546 Pain in thoracic spine: Secondary | ICD-10-CM | POA: Diagnosis not present

## 2023-12-03 DIAGNOSIS — G894 Chronic pain syndrome: Secondary | ICD-10-CM | POA: Diagnosis not present

## 2023-12-03 DIAGNOSIS — M545 Low back pain, unspecified: Secondary | ICD-10-CM | POA: Diagnosis not present

## 2023-12-03 DIAGNOSIS — Z79891 Long term (current) use of opiate analgesic: Secondary | ICD-10-CM | POA: Diagnosis not present

## 2023-12-03 DIAGNOSIS — M5106 Intervertebral disc disorders with myelopathy, lumbar region: Secondary | ICD-10-CM | POA: Diagnosis not present

## 2023-12-17 DIAGNOSIS — R1084 Generalized abdominal pain: Secondary | ICD-10-CM | POA: Diagnosis not present

## 2023-12-17 DIAGNOSIS — N201 Calculus of ureter: Secondary | ICD-10-CM | POA: Diagnosis not present

## 2023-12-17 DIAGNOSIS — N23 Unspecified renal colic: Secondary | ICD-10-CM | POA: Diagnosis not present

## 2024-01-05 DIAGNOSIS — N200 Calculus of kidney: Secondary | ICD-10-CM | POA: Diagnosis not present

## 2024-01-05 DIAGNOSIS — N209 Urinary calculus, unspecified: Secondary | ICD-10-CM | POA: Diagnosis not present

## 2024-01-05 DIAGNOSIS — E119 Type 2 diabetes mellitus without complications: Secondary | ICD-10-CM | POA: Diagnosis not present

## 2024-02-03 DIAGNOSIS — M47816 Spondylosis without myelopathy or radiculopathy, lumbar region: Secondary | ICD-10-CM | POA: Diagnosis not present

## 2024-02-03 DIAGNOSIS — G894 Chronic pain syndrome: Secondary | ICD-10-CM | POA: Diagnosis not present

## 2024-02-03 DIAGNOSIS — Z981 Arthrodesis status: Secondary | ICD-10-CM | POA: Diagnosis not present

## 2024-02-03 DIAGNOSIS — M47814 Spondylosis without myelopathy or radiculopathy, thoracic region: Secondary | ICD-10-CM | POA: Diagnosis not present

## 2024-02-12 DIAGNOSIS — E119 Type 2 diabetes mellitus without complications: Secondary | ICD-10-CM | POA: Diagnosis not present

## 2024-02-12 DIAGNOSIS — E785 Hyperlipidemia, unspecified: Secondary | ICD-10-CM | POA: Diagnosis not present

## 2024-02-12 DIAGNOSIS — G4700 Insomnia, unspecified: Secondary | ICD-10-CM | POA: Diagnosis not present

## 2024-04-02 DIAGNOSIS — M545 Low back pain, unspecified: Secondary | ICD-10-CM | POA: Diagnosis not present

## 2024-04-02 DIAGNOSIS — Z981 Arthrodesis status: Secondary | ICD-10-CM | POA: Diagnosis not present

## 2024-04-02 DIAGNOSIS — M47816 Spondylosis without myelopathy or radiculopathy, lumbar region: Secondary | ICD-10-CM | POA: Diagnosis not present

## 2024-04-02 DIAGNOSIS — G894 Chronic pain syndrome: Secondary | ICD-10-CM | POA: Diagnosis not present

## 2024-05-04 ENCOUNTER — Emergency Department (HOSPITAL_COMMUNITY)

## 2024-05-04 ENCOUNTER — Encounter (HOSPITAL_COMMUNITY): Payer: Self-pay

## 2024-05-04 ENCOUNTER — Emergency Department (HOSPITAL_COMMUNITY)
Admission: EM | Admit: 2024-05-04 | Discharge: 2024-05-04 | Disposition: A | Discharge status: Home / Self Care | Attending: Emergency Medicine | Admitting: Emergency Medicine

## 2024-05-04 ENCOUNTER — Other Ambulatory Visit: Payer: Self-pay

## 2024-05-04 DIAGNOSIS — E876 Hypokalemia: Secondary | ICD-10-CM | POA: Insufficient documentation

## 2024-05-04 DIAGNOSIS — R55 Syncope and collapse: Secondary | ICD-10-CM | POA: Diagnosis not present

## 2024-05-04 DIAGNOSIS — Z87891 Personal history of nicotine dependence: Secondary | ICD-10-CM | POA: Diagnosis not present

## 2024-05-04 DIAGNOSIS — R42 Dizziness and giddiness: Secondary | ICD-10-CM | POA: Diagnosis present

## 2024-05-04 DIAGNOSIS — R079 Chest pain, unspecified: Secondary | ICD-10-CM | POA: Diagnosis not present

## 2024-05-04 LAB — CBC WITH DIFFERENTIAL/PLATELET
Abs Immature Granulocytes: 0.01 K/uL (ref 0.00–0.07)
Basophils Absolute: 0 K/uL (ref 0.0–0.1)
Basophils Relative: 0 %
Eosinophils Absolute: 0.1 K/uL (ref 0.0–0.5)
Eosinophils Relative: 1 %
HCT: 47.5 % (ref 39.0–52.0)
Hemoglobin: 16.5 g/dL (ref 13.0–17.0)
Immature Granulocytes: 0 %
Lymphocytes Relative: 32 %
Lymphs Abs: 2.5 K/uL (ref 0.7–4.0)
MCH: 31.1 pg (ref 26.0–34.0)
MCHC: 34.7 g/dL (ref 30.0–36.0)
MCV: 89.6 fL (ref 80.0–100.0)
Monocytes Absolute: 0.3 K/uL (ref 0.1–1.0)
Monocytes Relative: 4 %
Neutro Abs: 4.8 K/uL (ref 1.7–7.7)
Neutrophils Relative %: 63 %
Platelets: 229 K/uL (ref 150–400)
RBC: 5.3 MIL/uL (ref 4.22–5.81)
RDW: 12 % (ref 11.5–15.5)
WBC: 7.7 K/uL (ref 4.0–10.5)
nRBC: 0 % (ref 0.0–0.2)

## 2024-05-04 LAB — BASIC METABOLIC PANEL WITH GFR
Anion gap: 10 (ref 5–15)
BUN: 13 mg/dL (ref 6–20)
CO2: 23 mmol/L (ref 22–32)
Calcium: 9.2 mg/dL (ref 8.9–10.3)
Chloride: 105 mmol/L (ref 98–111)
Creatinine, Ser: 0.98 mg/dL (ref 0.61–1.24)
GFR, Estimated: >60 mL/min (ref >60)
Glucose, Bld: 174 mg/dL — ABNORMAL HIGH (ref 70–99)
Potassium: 3.3 mmol/L — ABNORMAL LOW (ref 3.5–5.1)
Sodium: 138 mmol/L (ref 135–145)

## 2024-05-04 LAB — RESP PANEL BY RT-PCR (RSV, FLU A&B, COVID)  RVPGX2
Influenza A by PCR: NEGATIVE
Influenza B by PCR: NEGATIVE
Resp Syncytial Virus by PCR: NEGATIVE
SARS Coronavirus 2 by RT PCR: NEGATIVE

## 2024-05-04 LAB — I-STAT CHEM 8, ED
BUN: 13 mg/dL (ref 6–20)
Calcium, Ion: 1.14 mmol/L — ABNORMAL LOW (ref 1.15–1.40)
Chloride: 103 mmol/L (ref 98–111)
Creatinine, Ser: 0.9 mg/dL (ref 0.61–1.24)
Glucose, Bld: 179 mg/dL — ABNORMAL HIGH (ref 70–99)
HCT: 46 % (ref 39.0–52.0)
Hemoglobin: 15.6 g/dL (ref 13.0–17.0)
Potassium: 3.2 mmol/L — ABNORMAL LOW (ref 3.5–5.1)
Sodium: 140 mmol/L (ref 135–145)
TCO2: 21 mmol/L — ABNORMAL LOW (ref 22–32)

## 2024-05-04 LAB — TROPONIN I (HIGH SENSITIVITY)
Troponin I (High Sensitivity): 2 ng/L (ref <18)
Troponin I (High Sensitivity): 3 ng/L (ref <18)

## 2024-05-04 MED ORDER — LACTATED RINGERS IV BOLUS
1000.0000 mL | Freq: Once | INTRAVENOUS | Status: AC
  Administered 2024-05-04: 1000 mL via INTRAVENOUS

## 2024-05-04 NOTE — ED Notes (Signed)
 Alert, NAD, calm, interactive, resps e/u, speaking in clear complete sentences, VSS. Requesting urinal and wants his boots back on/ given.

## 2024-05-04 NOTE — ED Triage Notes (Signed)
 Pt. Arrived via POV c/o feeling lightheaded and dizziness while driving so drove straight to the ED; has a Hx of panic attacks, but states that this feels more physical. Pt. Is A/O x4, VSS; Pt. Is shaky and cold but is not febrile.

## 2024-05-04 NOTE — Discharge Instructions (Addendum)
 While you were in the emergency room, you had blood work that was normal.  Your chest x-ray was normal, your EKG was normal.  At this time, I do not of a clear cause for your symptoms.  I recommend drinking more water, and following up with your primary care doctor.

## 2024-05-04 NOTE — ED Provider Notes (Signed)
 [facility] Provider Note  CSN: [id number] Arrival date & time: 05/04/24 1030  Chief Complaint(s) Dizziness  HPI Leonard Ellis is a 43 y.o. male who is here today for dizziness.  Patient reports that while he was driving, he began to feel very dizzy, lightheaded.  Patient has a history of anxiety, panic attacks.  No history of coronary artery disease, diabetes or hypertension.   Past Medical History Past Medical History:  Diagnosis Date   Anxiety    Back pain    Depression    GERD (gastroesophageal reflux disease)    Hypercholesteremia    There are no active problems to display for this patient.  Home Medication(s) Prior to Admission medications   Medication Sig Start Date End Date Taking? Authorizing Provider  ALPRAZOLAM PO Take by mouth.    [provider]  DULoxetine HCl (CYMBALTA PO) Take by mouth.    [provider]  HYDROCODONE BITARTRATE PO Take by mouth.    [provider]  Omeprazole (PRILOSEC PO) Take by mouth.    [provider]  SIMVASTATIN PO Take by mouth.    [provider]                                                                                                                                    Past Surgical History Past Surgical History:  Procedure Laterality Date   BACK SURGERY     pyloric stenosis     Family History Family History  Adopted: Yes  Family history unknown: Yes    Social History Social History   Tobacco Use   Smoking status: Former   Smokeless tobacco: Former  Building services engineer status: Every Day  Substance Use Topics   Alcohol use: Yes    Comment: occasionally   Drug use: Never   Allergies Patient has no known allergies.  Review of Systems Review of Systems  Physical Exam Vital Signs  I have reviewed the triage vital signs BP 114/85   Pulse 93   Temp 98.1 F (36.7 C) (Oral)   Resp (!) 23   Ht 5' 10 (1.778 m)   Wt  98.9 kg   SpO2 97%   BMI 31.28 kg/m   Physical Exam Vitals reviewed.  HENT:     Head: Normocephalic.  Cardiovascular:     Rate and Rhythm: Normal rate.  Pulmonary:     Effort: Pulmonary effort is normal.  Abdominal:     General: Abdomen is flat.     Palpations: Abdomen is soft.  Musculoskeletal:     Cervical back: Normal range of motion.  Neurological:     General: No focal deficit present.     Mental Status: He is alert.     Cranial Nerves: No cranial nerve deficit.     ED Results and Treatments Labs (all labs ordered are listed, but only abnormal  results are displayed) Labs Reviewed  BASIC METABOLIC PANEL WITH GFR - Abnormal; Notable for the following components:      Result Value   Potassium 3.3 (*)    Glucose, Bld 174 (*)    All other components within normal limits  I-STAT CHEM 8, ED - Abnormal; Notable for the following components:   Potassium 3.2 (*)    Glucose, Bld 179 (*)    Calcium, Ion 1.14 (*)    TCO2 21 (*)    All other components within normal limits  RESP PANEL BY RT-PCR (RSV, FLU A&B, COVID)  RVPGX2  CBC WITH DIFFERENTIAL/PLATELET  TROPONIN I (HIGH SENSITIVITY)  TROPONIN I (HIGH SENSITIVITY)                                                                                                                          Radiology DG Chest Portable 1 View Result Date: 05/04/2024 CLINICAL DATA:  Chest pain EXAM: PORTABLE CHEST 1 VIEW COMPARISON:  03/10/2005 FINDINGS: The lungs appear clear. Cardiac and mediastinal contours normal. No blunting of the costophrenic angles. No significant bony findings. IMPRESSION: 1. No active cardiopulmonary disease is radiographically apparent. Electronically Signed   By: Ryan Salvage M.D.   On: 05/04/2024 11:47    Pertinent labs & imaging results that were available during my care of the patient were reviewed by me and considered in my medical decision making (see MDM for details).  Medications Ordered in ED Medications   lactated ringers  bolus 1,000 mL (0 mLs Intravenous Stopped 05/04/24 1431)                                                                                                                                     Procedures Procedures  (including critical care time)  Medical Decision Making / ED Course   This patient presents to the ED for concern of dizziness, this involves an extensive number of treatment options, and is a complaint that carries with it a high risk of complications and morbidity.  The differential diagnosis includes near syncope, vasovagal syncope, anxiety, dehydration, less likely CVA, less likely seizure.  MDM: On exam, patient overall well-appearing.  Was able to ambulate from his car to the emergency room.  His EKG, per my independent review, shows normal intervals, no evidence of acute ischemia.  Reviewing his blood work, negative high-sensitivity troponin.  No anemia, normal renal function.  Given his onset of symptoms, will obtain a delta troponin on the patient.  Will provide him with some IV fluids.  Reassessment 3:20 PM-my independent review of the patient's EKG shows no ST segment depressions or elevations, no T wave inversions, no evidence of acute ischemia.  Delta troponin negative.  Chest x-ray negative.  Patient feeling better.  Will have him follow-up with his PCP.   Additional history obtained:  -External records from outside source obtained and reviewed including: Chart review including previous notes, labs, imaging, consultation notes   Lab Tests: -I ordered, reviewed, and interpreted labs.   The pertinent results include:   Labs Reviewed  BASIC METABOLIC PANEL WITH GFR - Abnormal; Notable for the following components:      Result Value   Potassium 3.3 (*)    Glucose, Bld 174 (*)    All other components within normal limits  I-STAT CHEM 8, ED - Abnormal; Notable for the following components:   Potassium 3.2 (*)    Glucose, Bld 179 (*)    Calcium,  Ion 1.14 (*)    TCO2 21 (*)    All other components within normal limits  RESP PANEL BY RT-PCR (RSV, FLU A&B, COVID)  RVPGX2  CBC WITH DIFFERENTIAL/PLATELET  TROPONIN I (HIGH SENSITIVITY)  TROPONIN I (HIGH SENSITIVITY)      EKG my independent review of the patient's EKG shows no ST segment depressions or elevations, no T wave inversions, no evidence of acute ischemia.  EKG Interpretation Date/Time:  Tuesday May 04 2024 10:38:28 EDT Ventricular Rate:  103 PR Interval:  162 QRS Duration:  106 QT Interval:  350 QTC Calculation: 459 R Axis:   58  Text Interpretation: Sinus tachycardia Confirmed by Mannie Pac [phone]) on 05/04/2024 12:56:45 PM         Imaging Studies ordered: I ordered imaging studies including x-ray I independently visualized and interpreted imaging. I agree with the radiologist interpretation   Medicines ordered and prescription drug management: Meds ordered this encounter  Medications   lactated ringers  bolus 1,000 mL    -I have reviewed the patients home medicines and have made adjustments as needed    Cardiac Monitoring: The patient was maintained on a cardiac monitor.  I personally viewed and interpreted the cardiac monitored which showed an underlying rhythm of: Normal sinus rhythm  Social Determinants of Health:  Factors impacting patients care include: Lack of access to primary care   Reevaluation: After the interventions noted above, I reevaluated the patient and found that they have :improved  Co morbidities that complicate the patient evaluation  Past Medical History:  Diagnosis Date   Anxiety    Back pain    Depression    GERD (gastroesophageal reflux disease)    Hypercholesteremia       Dispostion: I considered admission for this patient, however he is appropriate for PCP follow-up.     Final Clinical Impression(s) / ED Diagnoses Final diagnoses:  Near syncope     @PCDICTATION @    Mannie Pac T,  DO 05/04/24 1524

## 2024-05-13 DIAGNOSIS — E876 Hypokalemia: Secondary | ICD-10-CM | POA: Diagnosis not present

## 2024-05-13 DIAGNOSIS — Z800 Family history of malignant neoplasm of digestive organs: Secondary | ICD-10-CM | POA: Diagnosis not present

## 2024-05-13 DIAGNOSIS — R42 Dizziness and giddiness: Secondary | ICD-10-CM | POA: Diagnosis not present

## 2024-05-25 NOTE — Progress Notes (Signed)
 Leonard Ellis

## 2024-06-11 DIAGNOSIS — R350 Frequency of micturition: Secondary | ICD-10-CM | POA: Diagnosis not present

## 2024-06-11 DIAGNOSIS — E119 Type 2 diabetes mellitus without complications: Secondary | ICD-10-CM | POA: Diagnosis not present

## 2024-06-17 DIAGNOSIS — Z981 Arthrodesis status: Secondary | ICD-10-CM | POA: Diagnosis not present

## 2024-06-17 DIAGNOSIS — G894 Chronic pain syndrome: Secondary | ICD-10-CM | POA: Diagnosis not present

## 2024-09-06 ENCOUNTER — Other Ambulatory Visit: Payer: Self-pay
